# Patient Record
Sex: Female | Born: 1970 | Race: White | Hispanic: No | Marital: Married | Smoking: Never smoker
Health system: Southern US, Community
[De-identification: ages and names within clinical notes are randomized; demographics above are authoritative.]

## PROBLEM LIST (undated history)

## (undated) DIAGNOSIS — E079 Disorder of thyroid, unspecified: Secondary | ICD-10-CM

## (undated) HISTORY — DX: Disorder of thyroid, unspecified: E07.9

---

## 1997-03-27 ENCOUNTER — Inpatient Hospital Stay (HOSPITAL_COMMUNITY): Admission: AD | Admit: 1997-03-27 | Discharge: 1997-03-30 | Payer: Self-pay | Admitting: Gynecology

## 1997-04-29 ENCOUNTER — Other Ambulatory Visit: Admission: RE | Admit: 1997-04-29 | Discharge: 1997-04-29 | Payer: Self-pay | Admitting: Obstetrics and Gynecology

## 1997-10-09 ENCOUNTER — Ambulatory Visit (HOSPITAL_COMMUNITY): Admission: RE | Admit: 1997-10-09 | Discharge: 1997-10-09 | Payer: Self-pay | Admitting: Dermatology

## 1998-03-13 ENCOUNTER — Other Ambulatory Visit: Admission: RE | Admit: 1998-03-13 | Discharge: 1998-03-13 | Payer: Self-pay | Admitting: Gynecology

## 1999-03-24 ENCOUNTER — Other Ambulatory Visit: Admission: RE | Admit: 1999-03-24 | Discharge: 1999-03-24 | Payer: Self-pay | Admitting: Gynecology

## 2000-02-25 ENCOUNTER — Other Ambulatory Visit: Admission: RE | Admit: 2000-02-25 | Discharge: 2000-02-25 | Payer: Self-pay | Admitting: Gynecology

## 2001-03-12 ENCOUNTER — Other Ambulatory Visit: Admission: RE | Admit: 2001-03-12 | Discharge: 2001-03-12 | Payer: Self-pay | Admitting: Gynecology

## 2002-02-19 ENCOUNTER — Other Ambulatory Visit: Admission: RE | Admit: 2002-02-19 | Discharge: 2002-02-19 | Payer: Self-pay | Admitting: Gynecology

## 2003-01-30 ENCOUNTER — Other Ambulatory Visit: Admission: RE | Admit: 2003-01-30 | Discharge: 2003-01-30 | Payer: Self-pay | Admitting: Gynecology

## 2004-02-06 ENCOUNTER — Other Ambulatory Visit: Admission: RE | Admit: 2004-02-06 | Discharge: 2004-02-06 | Payer: Self-pay | Admitting: Gynecology

## 2005-02-21 ENCOUNTER — Other Ambulatory Visit: Admission: RE | Admit: 2005-02-21 | Discharge: 2005-02-21 | Payer: Self-pay | Admitting: Gynecology

## 2005-02-22 ENCOUNTER — Ambulatory Visit (HOSPITAL_COMMUNITY): Admission: RE | Admit: 2005-02-22 | Discharge: 2005-02-22 | Payer: Self-pay | Admitting: Gynecology

## 2006-01-13 ENCOUNTER — Encounter: Admission: RE | Admit: 2006-01-13 | Discharge: 2006-01-13 | Payer: Self-pay | Admitting: Gynecology

## 2006-07-19 ENCOUNTER — Other Ambulatory Visit: Admission: RE | Admit: 2006-07-19 | Discharge: 2006-07-19 | Payer: Self-pay | Admitting: Gynecology

## 2007-07-19 ENCOUNTER — Other Ambulatory Visit: Admission: RE | Admit: 2007-07-19 | Discharge: 2007-07-19 | Payer: Self-pay | Admitting: Gynecology

## 2016-03-22 DIAGNOSIS — Z6823 Body mass index (BMI) 23.0-23.9, adult: Secondary | ICD-10-CM | POA: Diagnosis not present

## 2016-03-22 DIAGNOSIS — Z131 Encounter for screening for diabetes mellitus: Secondary | ICD-10-CM | POA: Diagnosis not present

## 2016-03-22 DIAGNOSIS — Z1231 Encounter for screening mammogram for malignant neoplasm of breast: Secondary | ICD-10-CM | POA: Diagnosis not present

## 2016-03-22 DIAGNOSIS — Z13220 Encounter for screening for lipoid disorders: Secondary | ICD-10-CM | POA: Diagnosis not present

## 2016-03-22 DIAGNOSIS — Z01419 Encounter for gynecological examination (general) (routine) without abnormal findings: Secondary | ICD-10-CM | POA: Diagnosis not present

## 2016-03-22 DIAGNOSIS — E039 Hypothyroidism, unspecified: Secondary | ICD-10-CM | POA: Diagnosis not present

## 2016-03-22 DIAGNOSIS — N912 Amenorrhea, unspecified: Secondary | ICD-10-CM | POA: Diagnosis not present

## 2017-03-27 DIAGNOSIS — R82998 Other abnormal findings in urine: Secondary | ICD-10-CM | POA: Diagnosis not present

## 2017-03-27 DIAGNOSIS — Z808 Family history of malignant neoplasm of other organs or systems: Secondary | ICD-10-CM | POA: Diagnosis not present

## 2017-03-27 DIAGNOSIS — Z13228 Encounter for screening for other metabolic disorders: Secondary | ICD-10-CM | POA: Diagnosis not present

## 2017-03-27 DIAGNOSIS — R399 Unspecified symptoms and signs involving the genitourinary system: Secondary | ICD-10-CM | POA: Diagnosis not present

## 2017-03-27 DIAGNOSIS — Z01419 Encounter for gynecological examination (general) (routine) without abnormal findings: Secondary | ICD-10-CM | POA: Diagnosis not present

## 2017-03-27 DIAGNOSIS — Z1231 Encounter for screening mammogram for malignant neoplasm of breast: Secondary | ICD-10-CM | POA: Diagnosis not present

## 2017-03-27 DIAGNOSIS — Z6825 Body mass index (BMI) 25.0-25.9, adult: Secondary | ICD-10-CM | POA: Diagnosis not present

## 2017-03-27 DIAGNOSIS — Z13220 Encounter for screening for lipoid disorders: Secondary | ICD-10-CM | POA: Diagnosis not present

## 2017-03-27 DIAGNOSIS — Z1329 Encounter for screening for other suspected endocrine disorder: Secondary | ICD-10-CM | POA: Diagnosis not present

## 2017-03-27 DIAGNOSIS — Z800 Family history of malignant neoplasm of digestive organs: Secondary | ICD-10-CM | POA: Diagnosis not present

## 2017-03-27 DIAGNOSIS — Z8042 Family history of malignant neoplasm of prostate: Secondary | ICD-10-CM | POA: Diagnosis not present

## 2017-03-27 DIAGNOSIS — Z1321 Encounter for screening for nutritional disorder: Secondary | ICD-10-CM | POA: Diagnosis not present

## 2017-03-27 DIAGNOSIS — E559 Vitamin D deficiency, unspecified: Secondary | ICD-10-CM | POA: Diagnosis not present

## 2017-08-11 DIAGNOSIS — M9905 Segmental and somatic dysfunction of pelvic region: Secondary | ICD-10-CM | POA: Diagnosis not present

## 2017-08-11 DIAGNOSIS — M5442 Lumbago with sciatica, left side: Secondary | ICD-10-CM | POA: Diagnosis not present

## 2017-08-11 DIAGNOSIS — M5137 Other intervertebral disc degeneration, lumbosacral region: Secondary | ICD-10-CM | POA: Diagnosis not present

## 2017-08-11 DIAGNOSIS — M9903 Segmental and somatic dysfunction of lumbar region: Secondary | ICD-10-CM | POA: Diagnosis not present

## 2017-08-14 DIAGNOSIS — M9903 Segmental and somatic dysfunction of lumbar region: Secondary | ICD-10-CM | POA: Diagnosis not present

## 2017-08-14 DIAGNOSIS — M9905 Segmental and somatic dysfunction of pelvic region: Secondary | ICD-10-CM | POA: Diagnosis not present

## 2017-08-14 DIAGNOSIS — M5137 Other intervertebral disc degeneration, lumbosacral region: Secondary | ICD-10-CM | POA: Diagnosis not present

## 2017-08-14 DIAGNOSIS — M5442 Lumbago with sciatica, left side: Secondary | ICD-10-CM | POA: Diagnosis not present

## 2017-08-17 DIAGNOSIS — M9903 Segmental and somatic dysfunction of lumbar region: Secondary | ICD-10-CM | POA: Diagnosis not present

## 2017-08-17 DIAGNOSIS — M9905 Segmental and somatic dysfunction of pelvic region: Secondary | ICD-10-CM | POA: Diagnosis not present

## 2017-08-17 DIAGNOSIS — M5442 Lumbago with sciatica, left side: Secondary | ICD-10-CM | POA: Diagnosis not present

## 2017-08-17 DIAGNOSIS — M5137 Other intervertebral disc degeneration, lumbosacral region: Secondary | ICD-10-CM | POA: Diagnosis not present

## 2017-09-11 DIAGNOSIS — M5442 Lumbago with sciatica, left side: Secondary | ICD-10-CM | POA: Diagnosis not present

## 2017-09-11 DIAGNOSIS — G8929 Other chronic pain: Secondary | ICD-10-CM | POA: Diagnosis not present

## 2017-09-14 DIAGNOSIS — D1801 Hemangioma of skin and subcutaneous tissue: Secondary | ICD-10-CM | POA: Diagnosis not present

## 2017-09-14 DIAGNOSIS — D2272 Melanocytic nevi of left lower limb, including hip: Secondary | ICD-10-CM | POA: Diagnosis not present

## 2017-09-14 DIAGNOSIS — D225 Melanocytic nevi of trunk: Secondary | ICD-10-CM | POA: Diagnosis not present

## 2017-09-14 DIAGNOSIS — D2262 Melanocytic nevi of left upper limb, including shoulder: Secondary | ICD-10-CM | POA: Diagnosis not present

## 2017-10-04 DIAGNOSIS — M545 Low back pain: Secondary | ICD-10-CM | POA: Diagnosis not present

## 2017-10-04 DIAGNOSIS — M5442 Lumbago with sciatica, left side: Secondary | ICD-10-CM | POA: Diagnosis not present

## 2017-10-12 DIAGNOSIS — M5442 Lumbago with sciatica, left side: Secondary | ICD-10-CM | POA: Diagnosis not present

## 2017-11-13 DIAGNOSIS — R2232 Localized swelling, mass and lump, left upper limb: Secondary | ICD-10-CM | POA: Diagnosis not present

## 2017-11-13 DIAGNOSIS — M79642 Pain in left hand: Secondary | ICD-10-CM | POA: Diagnosis not present

## 2017-11-13 DIAGNOSIS — M79641 Pain in right hand: Secondary | ICD-10-CM | POA: Diagnosis not present

## 2017-12-20 ENCOUNTER — Other Ambulatory Visit: Payer: Self-pay | Admitting: Radiology

## 2017-12-20 DIAGNOSIS — N644 Mastodynia: Secondary | ICD-10-CM

## 2017-12-26 ENCOUNTER — Other Ambulatory Visit: Payer: Self-pay

## 2017-12-29 ENCOUNTER — Ambulatory Visit
Admission: RE | Admit: 2017-12-29 | Discharge: 2017-12-29 | Disposition: A | Discharge status: Home / Self Care | Payer: BLUE CROSS/BLUE SHIELD | Source: Ambulatory Visit | Attending: Radiology | Admitting: Radiology

## 2017-12-29 DIAGNOSIS — R918 Other nonspecific abnormal finding of lung field: Secondary | ICD-10-CM | POA: Diagnosis not present

## 2017-12-29 DIAGNOSIS — N644 Mastodynia: Secondary | ICD-10-CM

## 2018-07-02 DIAGNOSIS — Z6826 Body mass index (BMI) 26.0-26.9, adult: Secondary | ICD-10-CM | POA: Diagnosis not present

## 2018-07-02 DIAGNOSIS — Z1231 Encounter for screening mammogram for malignant neoplasm of breast: Secondary | ICD-10-CM | POA: Diagnosis not present

## 2018-07-02 DIAGNOSIS — Z01419 Encounter for gynecological examination (general) (routine) without abnormal findings: Secondary | ICD-10-CM | POA: Diagnosis not present

## 2018-08-27 DIAGNOSIS — D1801 Hemangioma of skin and subcutaneous tissue: Secondary | ICD-10-CM | POA: Diagnosis not present

## 2018-08-27 DIAGNOSIS — D2262 Melanocytic nevi of left upper limb, including shoulder: Secondary | ICD-10-CM | POA: Diagnosis not present

## 2018-08-27 DIAGNOSIS — D2272 Melanocytic nevi of left lower limb, including hip: Secondary | ICD-10-CM | POA: Diagnosis not present

## 2018-08-27 DIAGNOSIS — D225 Melanocytic nevi of trunk: Secondary | ICD-10-CM | POA: Diagnosis not present

## 2019-07-04 DIAGNOSIS — Z6823 Body mass index (BMI) 23.0-23.9, adult: Secondary | ICD-10-CM | POA: Diagnosis not present

## 2019-07-04 DIAGNOSIS — Z1231 Encounter for screening mammogram for malignant neoplasm of breast: Secondary | ICD-10-CM | POA: Diagnosis not present

## 2019-07-04 DIAGNOSIS — Z1321 Encounter for screening for nutritional disorder: Secondary | ICD-10-CM | POA: Diagnosis not present

## 2019-07-04 DIAGNOSIS — Z01419 Encounter for gynecological examination (general) (routine) without abnormal findings: Secondary | ICD-10-CM | POA: Diagnosis not present

## 2019-07-04 DIAGNOSIS — Z1329 Encounter for screening for other suspected endocrine disorder: Secondary | ICD-10-CM | POA: Diagnosis not present

## 2019-07-04 DIAGNOSIS — Z13220 Encounter for screening for lipoid disorders: Secondary | ICD-10-CM | POA: Diagnosis not present

## 2019-07-04 DIAGNOSIS — Z131 Encounter for screening for diabetes mellitus: Secondary | ICD-10-CM | POA: Diagnosis not present

## 2019-08-20 DIAGNOSIS — Z20822 Contact with and (suspected) exposure to covid-19: Secondary | ICD-10-CM | POA: Diagnosis not present

## 2019-11-11 ENCOUNTER — Encounter: Payer: Self-pay | Admitting: Endocrinology

## 2019-11-11 ENCOUNTER — Other Ambulatory Visit: Payer: Self-pay

## 2019-11-11 ENCOUNTER — Ambulatory Visit (INDEPENDENT_AMBULATORY_CARE_PROVIDER_SITE_OTHER): Payer: BC Managed Care – PPO | Admitting: Endocrinology

## 2019-11-11 DIAGNOSIS — E039 Hypothyroidism, unspecified: Secondary | ICD-10-CM | POA: Insufficient documentation

## 2019-11-11 LAB — TSH: TSH: 0.02 u[IU]/mL — ABNORMAL LOW (ref 0.35–4.50)

## 2019-11-11 MED ORDER — LEVOTHYROXINE SODIUM 112 MCG PO TABS: 112.0000 ug | ORAL_TABLET | Freq: Every day | ORAL | 3 refills | Status: DC

## 2019-11-11 NOTE — Patient Instructions (Addendum)
Let's recheck the ultrasound.  you will receive a phone call, about a day and time for an appointment.   Blood tests are requested for you today.  We'll let you know about the results.    

## 2019-11-11 NOTE — Progress Notes (Signed)
Subjective:    Patient ID: Melissa Hartman, female    DOB: [birth year], 49 y.o.   MRN: [id number]  HPI Pt is referred by Dr Renaldo Fiddler, for hypothyroidism.  Pt reports hypothyroidism was dx'ed in mid-2021. she has been on prescribed thyroid hormone therapy since rx.  she has never taken kelp or any other type of non-prescribed thyroid product.  she has never had thyroid imaging.  She has never had thyroid surgery, or XRT to the neck.  she has never been on amiodarone or lithium.  She now report swelling at the ant neck.   Past Medical History:  Diagnosis Date  . Thyroid disease     History reviewed. No pertinent surgical history.  Social History   Socioeconomic History  . Marital status: Married    Spouse name: Not on file  . Number of children: Not on file  . Years of education: Not on file  . Highest education level: Not on file  Occupational History  . Not on file  Tobacco Use  . Smoking status: Never Smoker  . Smokeless tobacco: Current User  Substance and Sexual Activity  . Alcohol use: Never  . Drug use: Never  . Sexual activity: Not on file  Other Topics Concern  . Not on file  Social History Narrative  . Not on file   Social Determinants of Health   Financial Resource Strain:   . Difficulty of Paying Living Expenses: Not on file  Food Insecurity:   . Worried About Programme researcher, broadcasting/film/video in the Last Year: Not on file  . Ran Out of Food in the Last Year: Not on file  Transportation Needs:   . Lack of Transportation (Medical): Not on file  . Lack of Transportation (Non-Medical): Not on file  Physical Activity:   . Days of Exercise per Week: Not on file  . Minutes of Exercise per Session: Not on file  Stress:   . Feeling of Stress : Not on file  Social Connections:   . Frequency of Communication with Friends and Family: Not on file  . Frequency of Social Gatherings with Friends and Family: Not on file  . Attends Religious Services: Not on file  . Active Member of  Clubs or Organizations: Not on file  . Attends Banker Meetings: Not on file  . Marital Status: Not on file  Intimate Partner Violence:   . Fear of Current or Ex-Partner: Not on file  . Emotionally Abused: Not on file  . Physically Abused: Not on file  . Sexually Abused: Not on file    Current Outpatient Medications on File Prior to Visit  Medication Sig Dispense Refill  . BIOTIN PO Take by mouth.    . cyanocobalamin 100 MCG tablet Take by mouth.    Marland Kitchen VITAMIN D PO Take by mouth.     No current facility-administered medications on file prior to visit.    No Known Allergies  Family History  Problem Relation Age of Onset  . Thyroid disease Mother     BP 124/64   Pulse 78   Ht 5\' 2"  (1.575 m)   Wt 131 lb (59.4 kg)   SpO2 98%   BMI 23.96 kg/m    Review of Systems denies depression, weight gain, numbness, cold intolerance, and dry skin.  She has chronic hair loss and leg cramps.      Objective:   Physical Exam VS: see vs page GEN: no distress HEAD: head: no deformity eyes:  no periorbital swelling, no proptosis external nose and ears are normal NECK: supple, thyroid is not enlarged CHEST WALL: no deformity LUNGS: clear to auscultation CV: reg rate and rhythm, no murmur.  MUSCULOSKELETAL: gait is normal and steady EXTEMITIES: no deformity.  no leg edema NEURO:  cn 2-12 grossly intact.   readily moves all 4's.  sensation is intact to touch on all 4's SKIN:  Normal texture and temperature.  No rash or suspicious lesion is visible.   NODES:  None palpable at the neck PSYCH: alert, well-oriented.  Does not appear anxious nor depressed.  outside test results are reviewed: TSH=0.1  Lab Results  Component Value Date   TSH 0.02 (L) 11/11/2019    I have reviewed outside records, and summarized: Pt was noted to have abnormal TSH, and referred here.  She was seen for routine GYN exam, and overall health was good.     Assessment & Plan:  Hypothyroidism:  overreplaced.  I have sent a prescription to your pharmacy, to reduce synthroid Neck swelling, uncertain etiology and prognosis  Patient Instructions  Let's recheck the ultrasound.  you will receive a phone call, about a day and time for an appointment. Blood tests are requested for you today.  We'll let you know about the results.

## 2019-11-14 ENCOUNTER — Encounter: Payer: Self-pay | Admitting: Endocrinology

## 2019-11-14 ENCOUNTER — Telehealth: Payer: Self-pay | Admitting: Endocrinology

## 2019-11-14 NOTE — Telephone Encounter (Signed)
Patient states her OBGYN had prescribed her levothyroxine 137 but Dr Everardo All changed dose to 112 and she was wondering why it changed? Please give patient a call at [phone] (no interpreter needed).

## 2019-11-15 NOTE — Telephone Encounter (Signed)
Please advise-  Mychart message was sent also, you can respond via that.   Thank you!

## 2019-11-21 ENCOUNTER — Ambulatory Visit
Admission: RE | Admit: 2019-11-21 | Discharge: 2019-11-21 | Disposition: A | Discharge status: Home / Self Care | Payer: BC Managed Care – PPO | Source: Ambulatory Visit | Attending: Endocrinology | Admitting: Endocrinology

## 2019-11-21 DIAGNOSIS — E039 Hypothyroidism, unspecified: Secondary | ICD-10-CM

## 2020-02-24 DIAGNOSIS — D2261 Melanocytic nevi of right upper limb, including shoulder: Secondary | ICD-10-CM | POA: Diagnosis not present

## 2020-02-24 DIAGNOSIS — D692 Other nonthrombocytopenic purpura: Secondary | ICD-10-CM | POA: Diagnosis not present

## 2020-02-24 DIAGNOSIS — R208 Other disturbances of skin sensation: Secondary | ICD-10-CM | POA: Diagnosis not present

## 2020-05-25 IMAGING — MG DIGITAL DIAGNOSTIC UNILATERAL LEFT MAMMOGRAM WITH TOMO AND CAD
4 series · 4 of 12 positions shown · non-contrast
Comparison: Previous exam(s).

CLINICAL DATA: 47-year-old female with intermittent burning
sensation within the upper-outer quadrant of the left breast for
several weeks.

EXAM:
DIGITAL DIAGNOSTIC UNILATERAL LEFT MAMMOGRAM WITH CAD AND TOMO

[L MLO synth-2D]
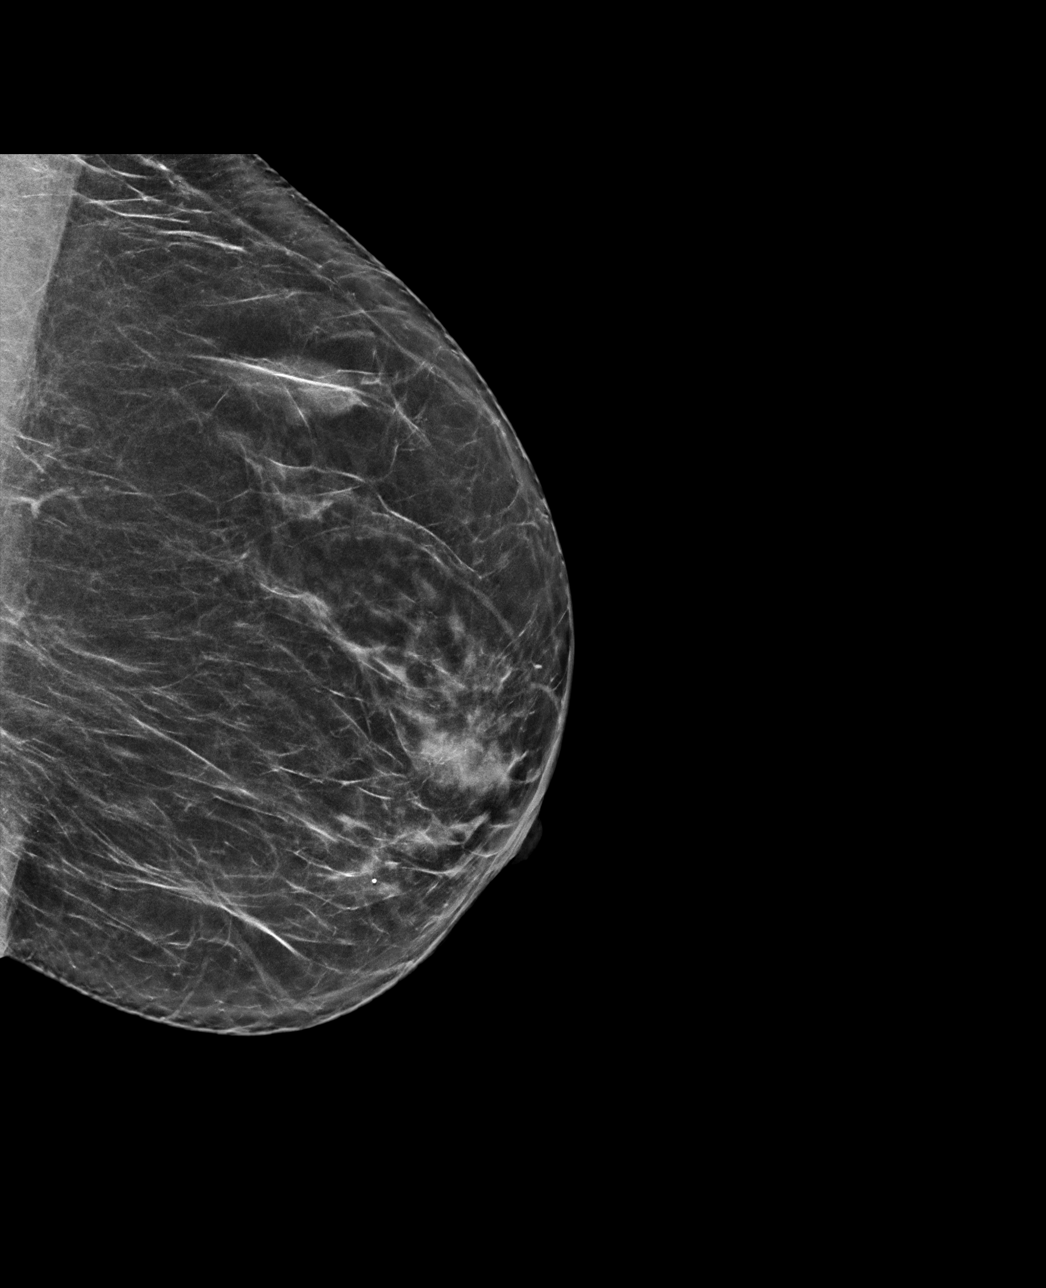

[L CC synth-2D]
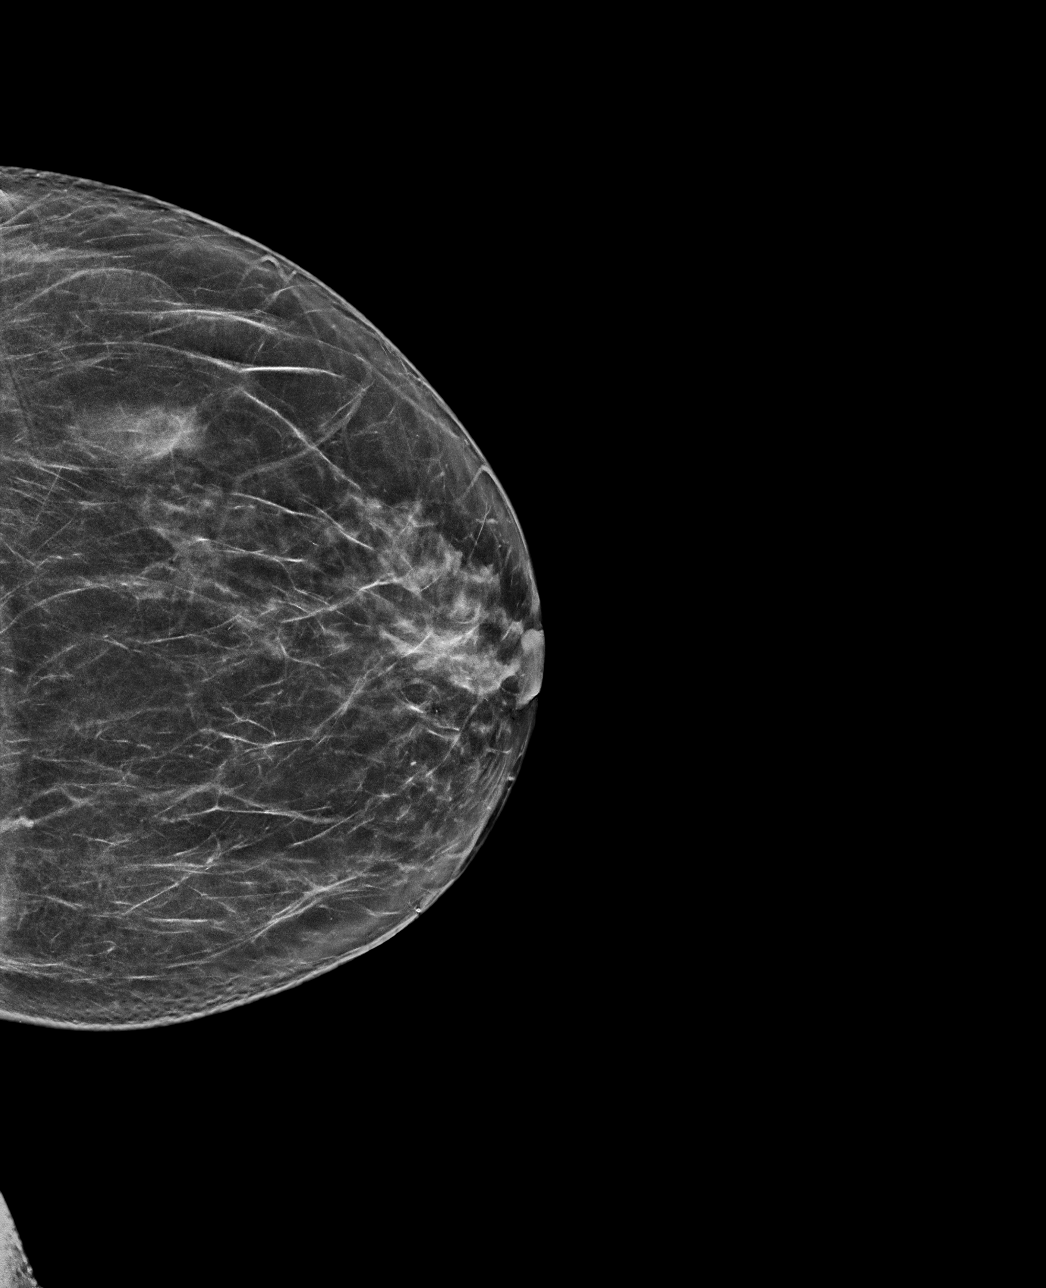

[L CC tomo · tomo slice 35/69.0]
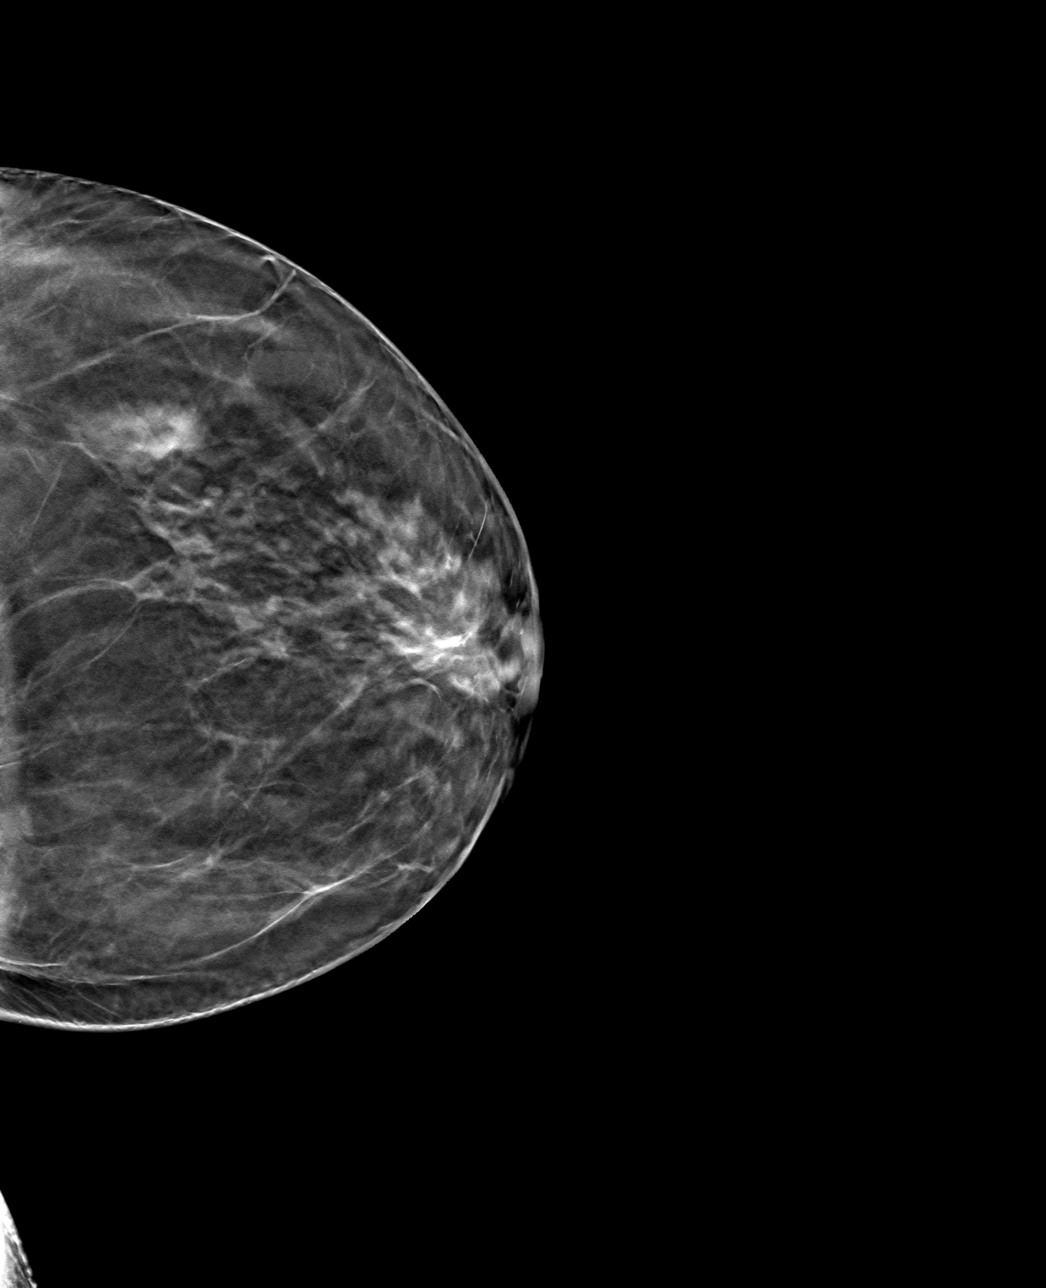

[L MLO tomo · tomo slice 36/71.0]
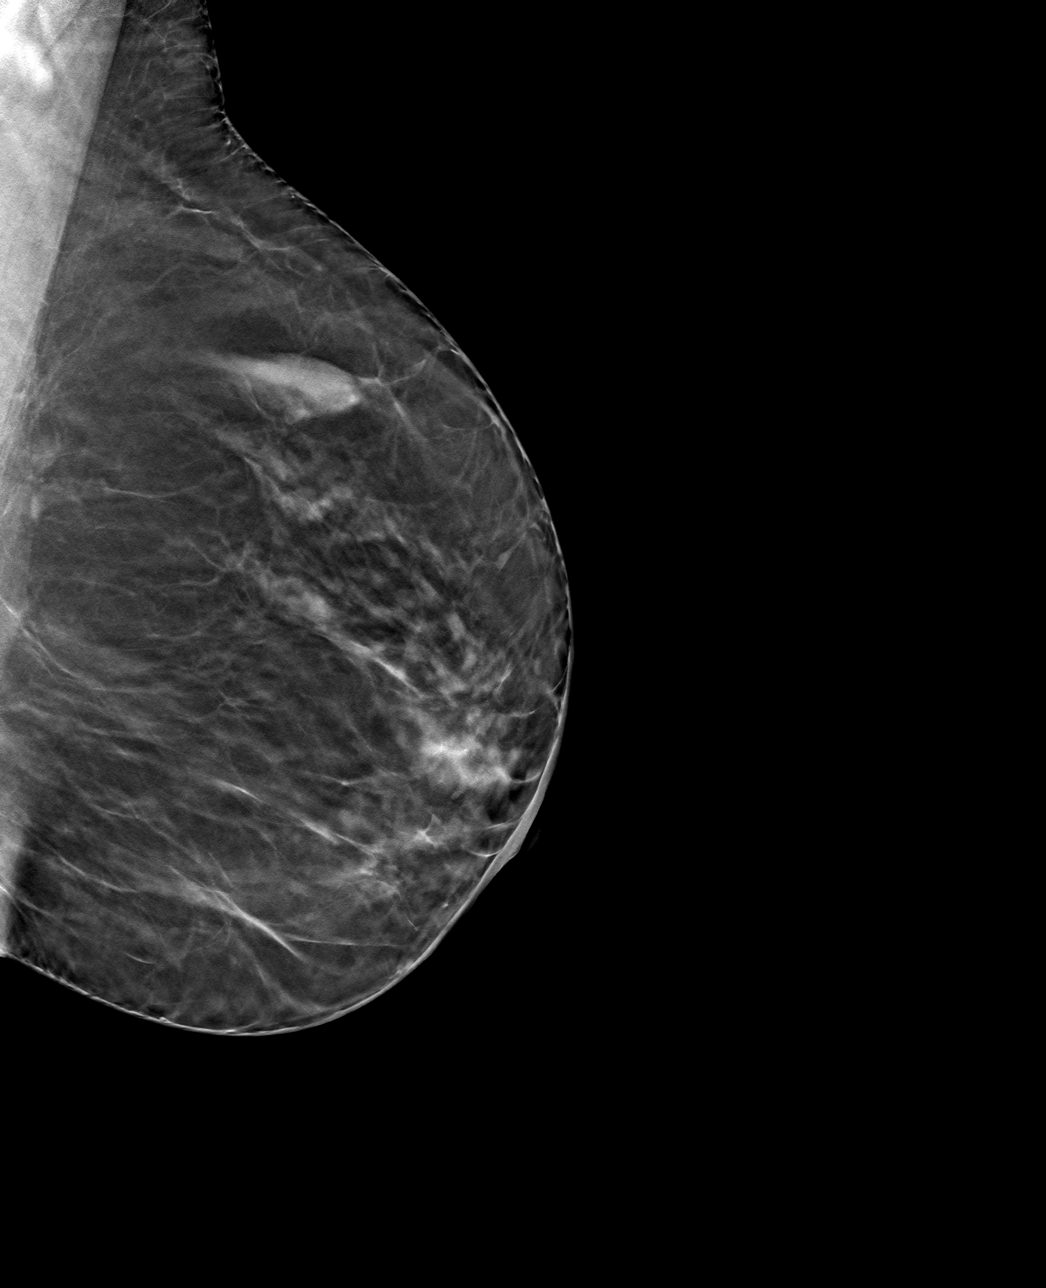

[4 of 12 positions shown; findings below may reference images not displayed]

ACR Breast Density Category b: There are scattered areas of
fibroglandular density.
FINDINGS: No suspicious mammographic findings are identified. The parenchymal
pattern is stable.

Mammographic images were processed with CAD.
IMPRESSION: No mammographic evidence of malignancy.

RECOMMENDATION:
1. Clinical follow-up recommended for the symptomatic area of
concern in the left breast. Any further workup should be based on
clinical grounds.
2. Patient is due for annual bilateral screening in March 2018.

I have discussed the findings and recommendations with the patient.
Results were also provided in writing at the conclusion of the
visit. If applicable, a reminder letter will be sent to the patient
regarding the next appointment.

BI-RADS CATEGORY  1: Negative.

## 2020-09-02 DIAGNOSIS — N958 Other specified menopausal and perimenopausal disorders: Secondary | ICD-10-CM | POA: Diagnosis not present

## 2020-09-02 DIAGNOSIS — Z13820 Encounter for screening for osteoporosis: Secondary | ICD-10-CM | POA: Diagnosis not present

## 2020-09-02 DIAGNOSIS — Z01419 Encounter for gynecological examination (general) (routine) without abnormal findings: Secondary | ICD-10-CM | POA: Diagnosis not present

## 2020-09-02 DIAGNOSIS — M8588 Other specified disorders of bone density and structure, other site: Secondary | ICD-10-CM | POA: Diagnosis not present

## 2020-09-02 DIAGNOSIS — Z1231 Encounter for screening mammogram for malignant neoplasm of breast: Secondary | ICD-10-CM | POA: Diagnosis not present

## 2020-09-02 DIAGNOSIS — Z6825 Body mass index (BMI) 25.0-25.9, adult: Secondary | ICD-10-CM | POA: Diagnosis not present

## 2020-10-07 ENCOUNTER — Telehealth: Payer: Self-pay | Admitting: Endocrinology

## 2020-10-07 NOTE — Telephone Encounter (Signed)
Pt is calling in for the lab work that was sent in from Dr office and inquiring about a levthyroxine dosage change. Pharm [facility] [address] Pt contact: [phone]

## 2020-10-09 ENCOUNTER — Other Ambulatory Visit: Payer: Self-pay | Admitting: Endocrinology

## 2020-10-09 MED ORDER — LEVOTHYROXINE SODIUM 100 MCG PO TABS: 100.0000 ug | ORAL_TABLET | Freq: Every day | ORAL | 3 refills | Status: DC

## 2020-10-09 NOTE — Telephone Encounter (Signed)
Detailed vm left for patient.  ?

## 2020-10-09 NOTE — Telephone Encounter (Signed)
Labs have been received and printed out for provider to review

## 2020-10-09 NOTE — Telephone Encounter (Signed)
Pt called about lab results (scanned in media files) about a thyroid medication adjustment. Pt doesn't use MyChart and cannot receive msgs. Pt insists a phone call would suffice. Also, [facility] [address] is her preference. Pt contact [phone]

## 2020-11-11 ENCOUNTER — Other Ambulatory Visit: Payer: Self-pay | Admitting: Endocrinology

## 2021-09-08 DIAGNOSIS — Z6823 Body mass index (BMI) 23.0-23.9, adult: Secondary | ICD-10-CM | POA: Diagnosis not present

## 2021-09-08 DIAGNOSIS — Z13228 Encounter for screening for other metabolic disorders: Secondary | ICD-10-CM | POA: Diagnosis not present

## 2021-09-08 DIAGNOSIS — Z1329 Encounter for screening for other suspected endocrine disorder: Secondary | ICD-10-CM | POA: Diagnosis not present

## 2021-09-08 DIAGNOSIS — Z1231 Encounter for screening mammogram for malignant neoplasm of breast: Secondary | ICD-10-CM | POA: Diagnosis not present

## 2021-09-08 DIAGNOSIS — R319 Hematuria, unspecified: Secondary | ICD-10-CM | POA: Diagnosis not present

## 2021-09-08 DIAGNOSIS — Z13220 Encounter for screening for lipoid disorders: Secondary | ICD-10-CM | POA: Diagnosis not present

## 2021-09-08 DIAGNOSIS — Z01419 Encounter for gynecological examination (general) (routine) without abnormal findings: Secondary | ICD-10-CM | POA: Diagnosis not present

## 2021-09-10 DIAGNOSIS — D225 Melanocytic nevi of trunk: Secondary | ICD-10-CM | POA: Diagnosis not present

## 2021-09-10 DIAGNOSIS — R208 Other disturbances of skin sensation: Secondary | ICD-10-CM | POA: Diagnosis not present

## 2021-09-10 DIAGNOSIS — L814 Other melanin hyperpigmentation: Secondary | ICD-10-CM | POA: Diagnosis not present

## 2021-09-10 DIAGNOSIS — L821 Other seborrheic keratosis: Secondary | ICD-10-CM | POA: Diagnosis not present

## 2021-09-10 DIAGNOSIS — D1801 Hemangioma of skin and subcutaneous tissue: Secondary | ICD-10-CM | POA: Diagnosis not present

## 2021-10-22 ENCOUNTER — Ambulatory Visit (AMBULATORY_SURGERY_CENTER): Payer: Self-pay

## 2021-10-22 DIAGNOSIS — Z1211 Encounter for screening for malignant neoplasm of colon: Secondary | ICD-10-CM

## 2021-10-22 NOTE — Progress Notes (Signed)
Spoke with pt over phone for PV. Pt states she was told her colonoscopy would be Nov 10th. [No record of appt change in Epic].  Offered other dates in Nov, but pt declines, sating she will call back to reschedule when she can coordinate with a care partner.  Cancelled 11/05/21 [facility]-colon.

## 2021-11-05 ENCOUNTER — Encounter: Payer: BC Managed Care – PPO | Admitting: Internal Medicine

## 2021-11-11 ENCOUNTER — Other Ambulatory Visit: Payer: Self-pay

## 2021-11-22 ENCOUNTER — Other Ambulatory Visit: Payer: Self-pay | Admitting: Internal Medicine

## 2021-11-24 DIAGNOSIS — E039 Hypothyroidism, unspecified: Secondary | ICD-10-CM | POA: Diagnosis not present

## 2021-12-15 ENCOUNTER — Other Ambulatory Visit: Payer: Self-pay | Admitting: Internal Medicine

## 2022-01-21 ENCOUNTER — Ambulatory Visit: Payer: Self-pay | Admitting: Internal Medicine

## 2022-02-11 DIAGNOSIS — M79629 Pain in unspecified upper arm: Secondary | ICD-10-CM | POA: Diagnosis not present

## 2022-04-17 IMAGING — US US THYROID
1 series · 14 of 25 positions shown · non-contrast
Comparison: None.

CLINICAL DATA: Other. 49-year-old female with anterior neck
swelling

EXAM:
THYROID ULTRASOUND
TECHNIQUE: Ultrasound examination of the thyroid gland and adjacent soft
tissues was performed.

[Series 1: us thyroid · 0.04mm/px · 14 of 42 slices shown]
[im 1/42]
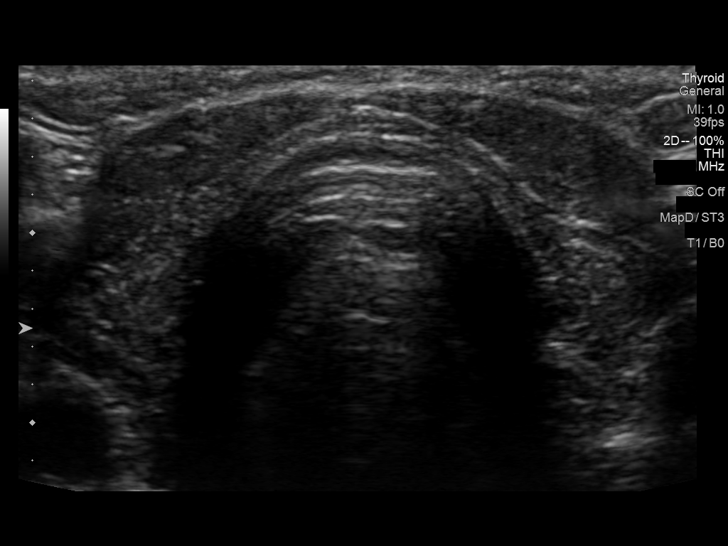
[im 4/42]
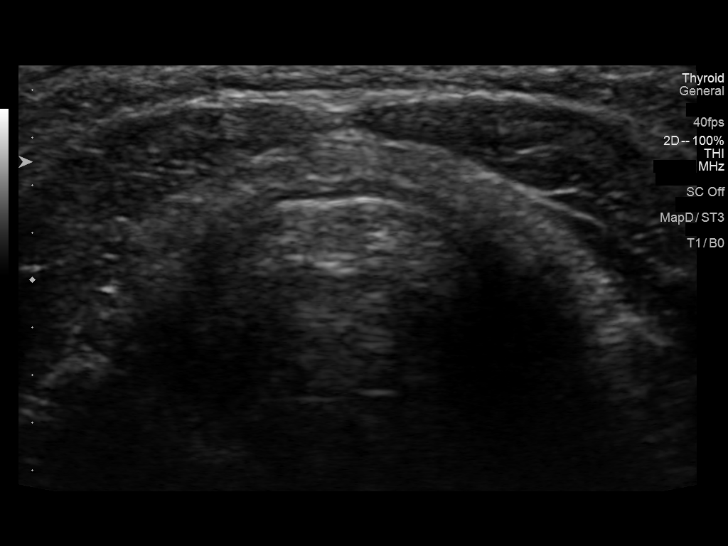
[im 7/42]
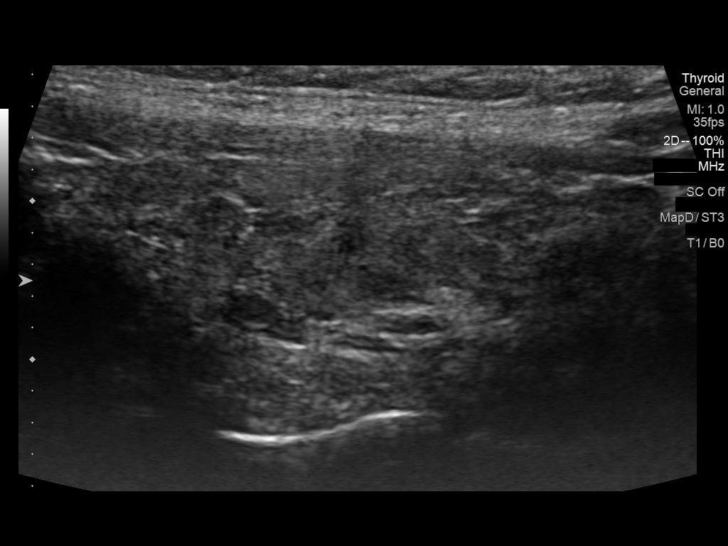
[im 11/42]
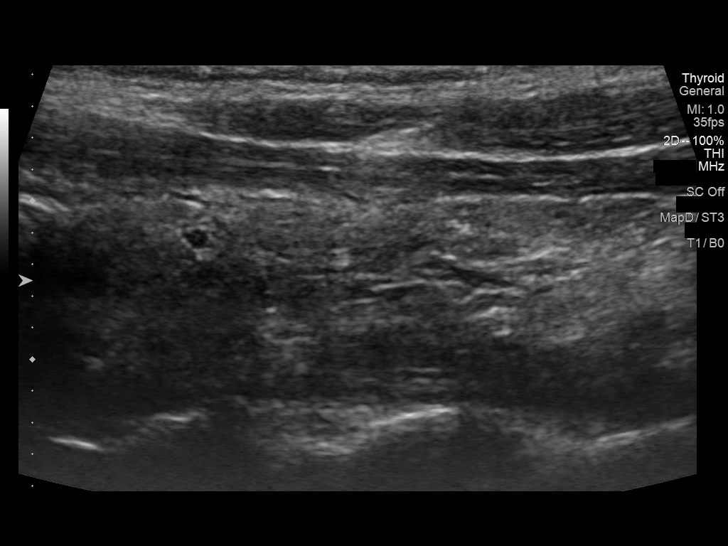
[im 14/42]
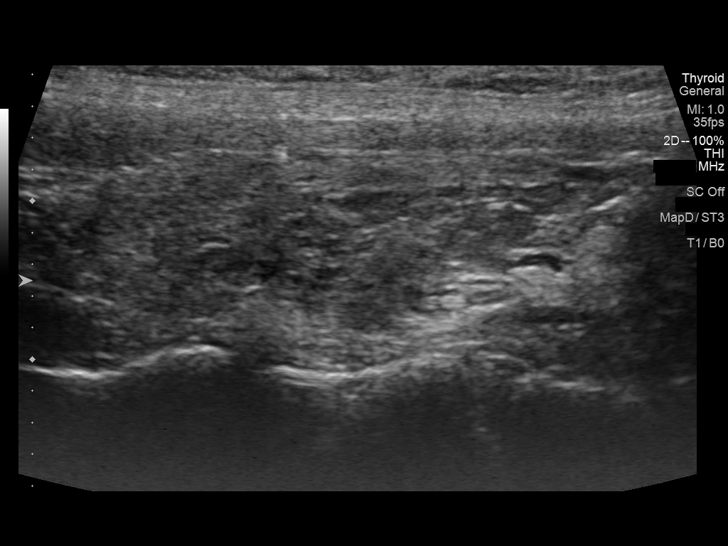
[im 16/42]
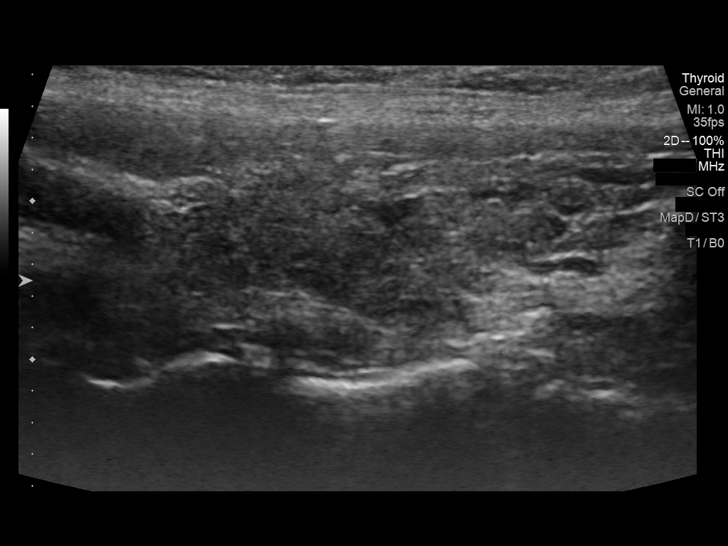
[im 19/42]
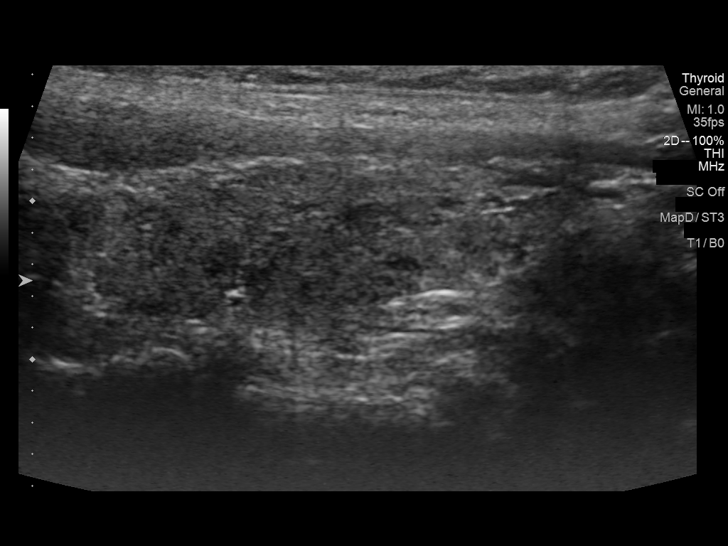
[im 23/42]
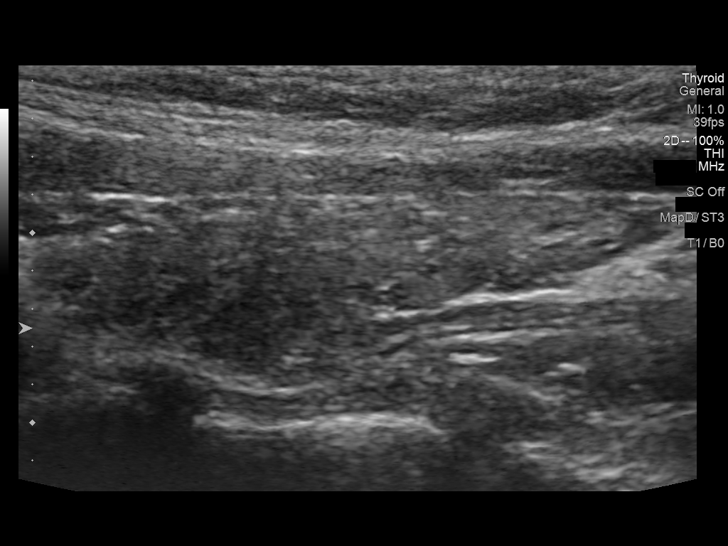
[im 26/42]
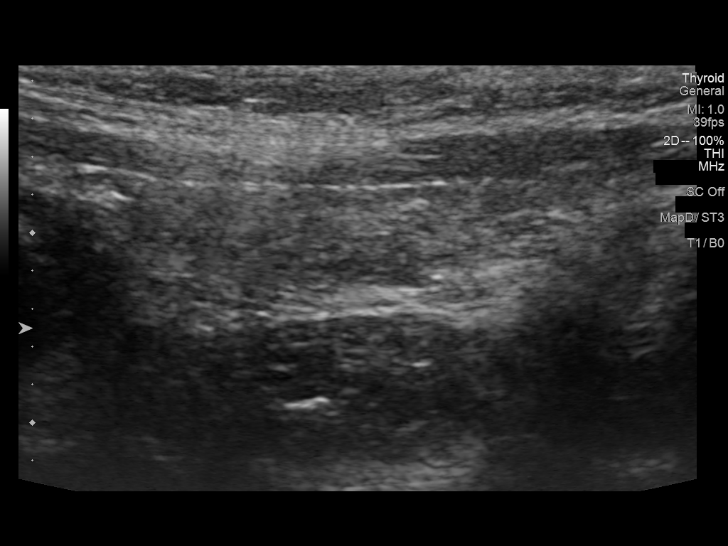
[im 28/42]
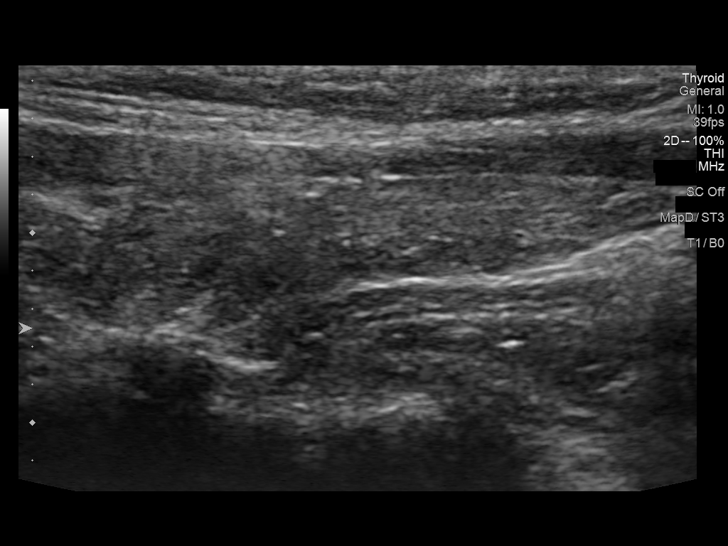
[im 31/42]
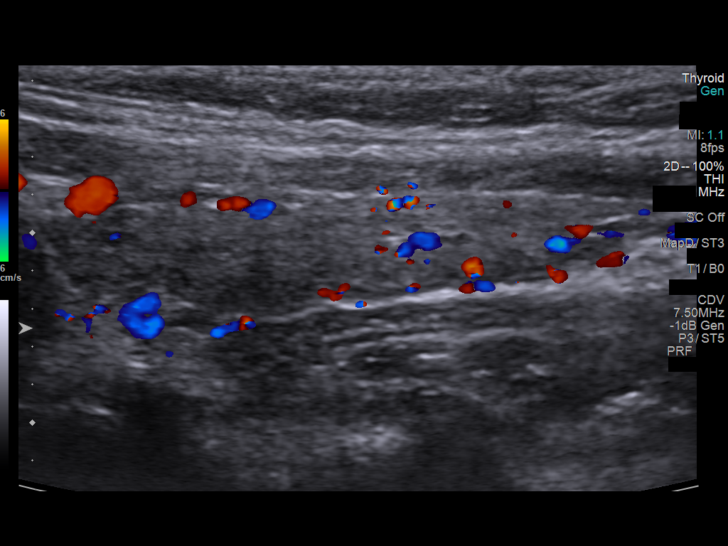
[im 35/42]
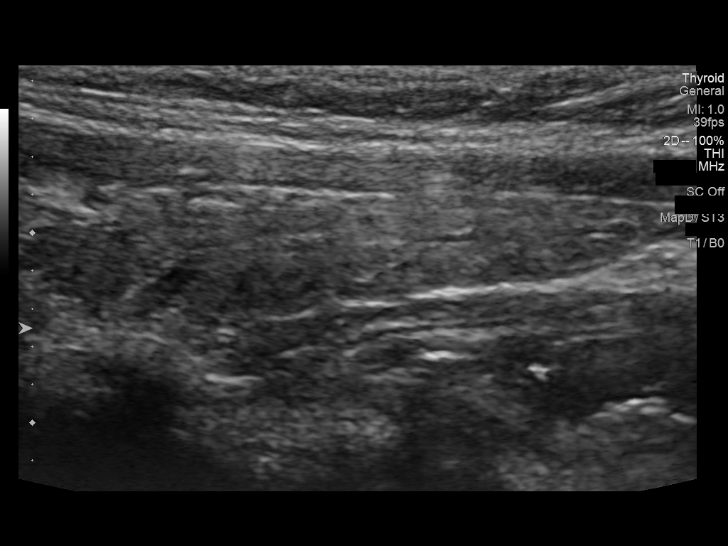
[im 38/42]
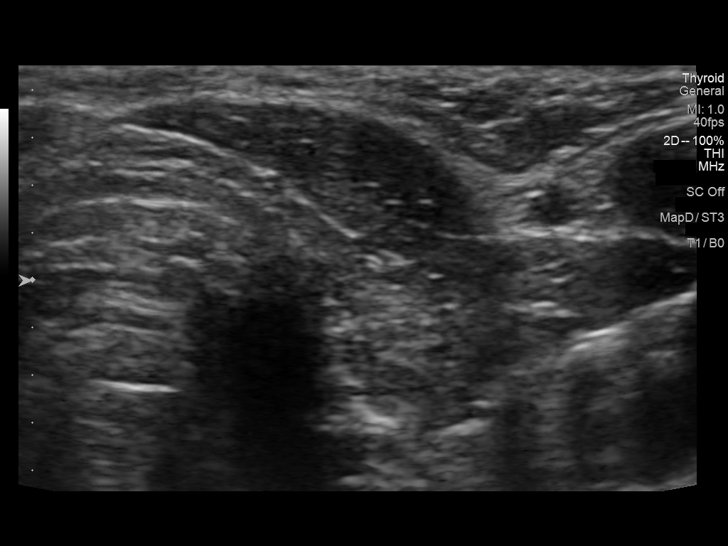
[im 42/42]
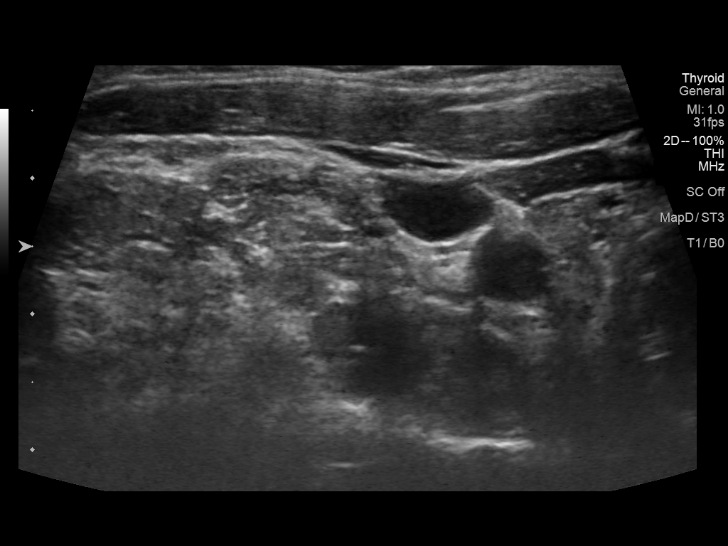

[14 of 25 positions shown; findings below may reference images not displayed]

FINDINGS: Parenchymal Echotexture: Moderately heterogenous

Isthmus: 0.1 cm

Right lobe: 3.9 x 1.2 x 1.0 cm

Left lobe: 3.3 x 0.9 x 0.8 cm

_________________________________________________________

Estimated total number of nodules >/= 1 cm: 0

Number of spongiform nodules >/=  2 cm not described below (TR1): 0

Number of mixed cystic and solid nodules >/= 1.5 cm not described
below (TR2): 0

_________________________________________________________

No discrete nodules are seen within the thyroid gland.
IMPRESSION: Heterogeneous parenchymal echotexture of a normal sized thyroid
gland without discrete nodules.

## 2022-09-13 DIAGNOSIS — Z1321 Encounter for screening for nutritional disorder: Secondary | ICD-10-CM | POA: Diagnosis not present

## 2022-09-13 DIAGNOSIS — Z13228 Encounter for screening for other metabolic disorders: Secondary | ICD-10-CM | POA: Diagnosis not present

## 2022-09-13 DIAGNOSIS — Z1231 Encounter for screening mammogram for malignant neoplasm of breast: Secondary | ICD-10-CM | POA: Diagnosis not present

## 2022-09-13 DIAGNOSIS — Z131 Encounter for screening for diabetes mellitus: Secondary | ICD-10-CM | POA: Diagnosis not present

## 2022-09-13 DIAGNOSIS — Z13220 Encounter for screening for lipoid disorders: Secondary | ICD-10-CM | POA: Diagnosis not present

## 2022-09-13 DIAGNOSIS — Z6826 Body mass index (BMI) 26.0-26.9, adult: Secondary | ICD-10-CM | POA: Diagnosis not present

## 2022-09-13 DIAGNOSIS — Z01419 Encounter for gynecological examination (general) (routine) without abnormal findings: Secondary | ICD-10-CM | POA: Diagnosis not present

## 2022-09-13 DIAGNOSIS — Z1329 Encounter for screening for other suspected endocrine disorder: Secondary | ICD-10-CM | POA: Diagnosis not present

## 2022-09-13 DIAGNOSIS — Z124 Encounter for screening for malignant neoplasm of cervix: Secondary | ICD-10-CM | POA: Diagnosis not present

## 2022-09-13 DIAGNOSIS — Z1151 Encounter for screening for human papillomavirus (HPV): Secondary | ICD-10-CM | POA: Diagnosis not present

## 2022-11-25 ENCOUNTER — Encounter: Payer: Self-pay | Admitting: Internal Medicine

## 2023-01-06 DIAGNOSIS — E039 Hypothyroidism, unspecified: Secondary | ICD-10-CM | POA: Diagnosis not present

## 2023-01-23 ENCOUNTER — Ambulatory Visit (AMBULATORY_SURGERY_CENTER): Payer: Self-pay

## 2023-01-23 VITALS — Ht 61.5 in | Wt 138.0 lb

## 2023-01-23 DIAGNOSIS — Z1211 Encounter for screening for malignant neoplasm of colon: Secondary | ICD-10-CM

## 2023-01-23 MED ORDER — NA SULFATE-K SULFATE-MG SULF 17.5-3.13-1.6 GM/177ML PO SOLN: 1.0000 | Freq: Once | ORAL | 0 refills | Status: DC

## 2023-01-23 NOTE — Progress Notes (Signed)
Patient did not show for her in-person, previsit appt.  Reached out to patient per telephone.  She is bilingual and speaks excellent English, states she does not need an interpreter.  Pre-visit completed via telephone.

## 2023-01-23 NOTE — Progress Notes (Signed)

## 2023-02-28 ENCOUNTER — Other Ambulatory Visit: Payer: Self-pay

## 2023-02-28 ENCOUNTER — Telehealth: Payer: Self-pay | Admitting: Internal Medicine

## 2023-02-28 DIAGNOSIS — Z1211 Encounter for screening for malignant neoplasm of colon: Secondary | ICD-10-CM

## 2023-02-28 MED ORDER — NA SULFATE-K SULFATE-MG SULF 17.5-3.13-1.6 GM/177ML PO SOLN: 1.0000 | Freq: Once | ORAL | 0 refills | Status: AC

## 2023-02-28 NOTE — Telephone Encounter (Signed)
 Inbound call from patient stating she has not received prep medication for 2/28 colonoscopy. Patient was not advise of [facility] on 1/20 pre visit. Requesting a call back. Please advise, thank you.

## 2023-02-28 NOTE — Telephone Encounter (Signed)
 Rx re sent pt made aware

## 2023-03-01 ENCOUNTER — Encounter: Payer: Self-pay | Admitting: Internal Medicine

## 2023-03-03 ENCOUNTER — Encounter: Payer: Self-pay | Admitting: Internal Medicine

## 2023-03-03 ENCOUNTER — Ambulatory Visit (AMBULATORY_SURGERY_CENTER): Payer: Self-pay | Admitting: Internal Medicine

## 2023-03-03 VITALS — BP 94/71 | HR 62 | Temp 98.0°F | Resp 13 | Ht 61.5 in | Wt 138.0 lb

## 2023-03-03 DIAGNOSIS — Z1211 Encounter for screening for malignant neoplasm of colon: Secondary | ICD-10-CM | POA: Diagnosis not present

## 2023-03-03 DIAGNOSIS — K648 Other hemorrhoids: Secondary | ICD-10-CM

## 2023-03-03 MED ORDER — SODIUM CHLORIDE 0.9 % IV SOLN: 500.0000 mL | Freq: Once | INTRAVENOUS | Status: DC

## 2023-03-03 NOTE — Progress Notes (Signed)
 Pt's states no medical or surgical changes since previsit or office visit.

## 2023-03-03 NOTE — Progress Notes (Signed)
 Pt denies need to pass air while in the RR.  Abdomen soft, denies tenderness.  Encouraged to ambulate, drink warm fluids at home if she develops abdominal tenderness

## 2023-03-03 NOTE — Op Note (Signed)
 [facility] Patient Name: Melissa Hartman Procedure Date: 03/03/2023 7:59 AM MRN: [id number] Endoscopist: Particia Lather , , [id number] Age: 53 Referring MD:  Date of Birth: 06/04/1970 Gender: Female Account #: [id number] Procedure:                Colonoscopy Indications:              Screening for colorectal malignant neoplasm, This                            is the patient's first colonoscopy Medicines:                Monitored Anesthesia Care Procedure:                Pre-Anesthesia Assessment:                           - Prior to the procedure, a History and Physical                            was performed, and patient medications and                            allergies were reviewed. The patient's tolerance of                            previous anesthesia was also reviewed. The risks                            and benefits of the procedure and the sedation                            options and risks were discussed with the patient.                            All questions were answered, and informed consent                            was obtained. Prior Anticoagulants: The patient has                            taken no anticoagulant or antiplatelet agents. ASA                            Grade Assessment: II - A patient with mild systemic                            disease. After reviewing the risks and benefits,                            the patient was deemed in satisfactory condition to                            undergo the procedure.  After obtaining informed consent, the colonoscope                            was passed under direct vision. Throughout the                            procedure, the patient's blood pressure, pulse, and                            oxygen saturations were monitored continuously. The                            CF [id number] #[id number] was introduced through the anus                            and advanced to  the the terminal ileum. The                            colonoscopy was performed without difficulty. The                            patient tolerated the procedure well. The quality                            of the bowel preparation was excellent. The                            terminal ileum, ileocecal valve, appendiceal                            orifice, and rectum were photographed. Scope In: 8:20:02 AM Scope Out: 8:35:24 AM Scope Withdrawal Time: 0 hours 10 minutes 39 seconds  Total Procedure Duration: 0 hours 15 minutes 22 seconds  Findings:                 The terminal ileum appeared normal.                           Non-bleeding internal hemorrhoids were found during                            retroflexion. Complications:            No immediate complications. Estimated Blood Loss:     Estimated blood loss: none. Impression:               - The examined portion of the ileum was normal.                           - Non-bleeding internal hemorrhoids.                           - No specimens collected. Recommendation:           - Discharge patient to home (with escort).                           -  Repeat colonoscopy in 10 years for screening                            purposes.                           - The findings and recommendations were discussed                            with the patient. Dr Particia Lather "[place]" [place],  03/03/2023 8:38:51 AM

## 2023-03-03 NOTE — Patient Instructions (Addendum)
 Repeat colonoscopy in 10 years for screening purposes.   Please read over handout                         YOU HAD AN ENDOSCOPIC PROCEDURE TODAY AT THE [facility]:   Refer to the procedure report that was given to you for any specific questions about what was found during the examination.  If the procedure report does not answer your questions, please call your gastroenterologist to clarify.  If you requested that your care partner not be given the details of your procedure findings, then the procedure report has been included in a sealed envelope for you to review at your convenience later.  YOU SHOULD EXPECT: Some feelings of bloating in the abdomen. Passage of more gas than usual.  Walking can help get rid of the air that was put into your GI tract during the procedure and reduce the bloating. If you had a lower endoscopy (such as a colonoscopy or flexible sigmoidoscopy) you may notice spotting of blood in your stool or on the toilet paper. If you underwent a bowel prep for your procedure, you may not have a normal bowel movement for a few days.  Please Note:  You might notice some irritation and congestion in your nose or some drainage.  This is from the oxygen used during your procedure.  There is no need for concern and it should clear up in a day or so.  SYMPTOMS TO REPORT IMMEDIATELY:  Following lower endoscopy (colonoscopy or flexible sigmoidoscopy):  Excessive amounts of blood in the stool  Significant tenderness or worsening of abdominal pains  Swelling of the abdomen that is new, acute  Fever of 100F or higher For urgent or emergent issues, a gastroenterologist can be reached at any hour by calling [phone]. Do not use MyChart messaging for urgent concerns.    DIET:  We do recommend a small meal at first, but then you may proceed to your regular diet.  Drink plenty of fluids but you should avoid alcoholic beverages for 24 hours.  ACTIVITY:  You should plan to  take it easy for the rest of today and you should NOT DRIVE or use heavy machinery until tomorrow (because of the sedation medicines used during the test).    FOLLOW UP: Our staff will call the number listed on your records the next business day following your procedure.  We will call around 7:15- 8:00 am to check on you and address any questions or concerns that you may have regarding the information given to you following your procedure. If we do not reach you, we will leave a message.     SIGNATURES/CONFIDENTIALITY: You and/or your care partner have signed paperwork which will be entered into your electronic medical record.  These signatures attest to the fact that that the information above on your After Visit Summary has been reviewed and is understood.  Full responsibility of the confidentiality of this discharge information lies with you and/or your care-partner.

## 2023-03-03 NOTE — Progress Notes (Signed)
 To pacu, VSS. Report to RN.tb

## 2023-03-03 NOTE — Progress Notes (Signed)
 GASTROENTEROLOGY PROCEDURE H&P NOTE   Primary Care Physician: Patient, No Pcp Per    Reason for Procedure:   Colon cancer screening  Plan:    Colonoscopy  Patient is appropriate for endoscopic procedure(s) in the ambulatory ([facility]) setting.  The nature of the procedure, as well as the risks, benefits, and alternatives were carefully and thoroughly reviewed with the patient. Ample time for discussion and questions allowed. The patient understood, was satisfied, and agreed to proceed.     HPI: Melissa Hartman is a 53 y.o. female who presents for colonoscopy for colon cancer screening. Denies blood in stools, changes in bowel habits, or unintentional weight loss. Denies family history of colon cancer. This is her first colonoscopy.  Past Medical History:  Diagnosis Date   Thyroid disease     History reviewed. No pertinent surgical history.  Prior to Admission medications   Medication Sig Start Date End Date Taking? Authorizing Provider  BIOTIN PO Take by mouth.   Yes [provider]  cyanocobalamin 100 MCG tablet Take by mouth.   Yes [provider]  levothyroxine (SYNTHROID) 112 MCG tablet Take 112 mcg by mouth daily before breakfast. 01/10/23  Yes [provider]  VITAMIN D PO Take by mouth.   Yes [provider]    Current Outpatient Medications  Medication Sig Dispense Refill   BIOTIN PO Take by mouth.     cyanocobalamin 100 MCG tablet Take by mouth.     levothyroxine (SYNTHROID) 112 MCG tablet Take 112 mcg by mouth daily before breakfast.     VITAMIN D PO Take by mouth.     Current Facility-Administered Medications  Medication Dose Route Frequency Provider Last Rate Last Admin   0.9 %  sodium chloride infusion  500 mL Intravenous Once Imogene Burn, MD        Allergies as of 03/03/2023   (No Known Allergies)    Family History  Problem Relation Age of Onset   Thyroid disease Mother    Colon cancer Neg Hx     Esophageal cancer Neg Hx    Rectal cancer Neg Hx    Stomach cancer Neg Hx     Social History   Socioeconomic History   Marital status: Married    Spouse name: Not on file   Number of children: Not on file   Years of education: Not on file   Highest education level: Not on file  Occupational History   Not on file  Tobacco Use   Smoking status: Never   Smokeless tobacco: Current  Substance and Sexual Activity   Alcohol use: Never   Drug use: Never   Sexual activity: Not on file  Other Topics Concern   Not on file  Social History Narrative   Not on file   Social Drivers of Health   Financial Resource Strain: Not on file  Food Insecurity: Not on file  Transportation Needs: Not on file  Physical Activity: Not on file  Stress: Not on file  Social Connections: Not on file  Intimate Partner Violence: Not on file    Physical Exam: Vital signs in last 24 hours: BP 97/60   Pulse 62   Temp 98 F (36.7 C)   Resp 16   Ht 5' 1.5" (1.562 m)   Wt 138 lb (62.6 kg)   SpO2 100%   BMI 25.65 kg/m  GEN: NAD EYE: Sclerae anicteric ENT: MMM CV: Non-tachycardic Pulm: No increased work of breathing GI: Soft, NT/ND  NEURO:  Alert & Oriented   Eulah Pont, MD [facility]  03/03/2023 8:17 AM

## 2023-03-06 ENCOUNTER — Telehealth: Payer: Self-pay

## 2023-03-06 NOTE — Telephone Encounter (Signed)
  Follow up Call-     03/03/2023    7:11 AM  Call back number  Post procedure Call Back phone  # [phone]  Permission to leave phone message Yes     Patient questions:  Do you have a fever, pain , or abdominal swelling? No. Pain Score  0 *  Have you tolerated food without any problems? Yes.  Have you been able to return to your normal activities? Yes.    Do you have any questions about your discharge instructions: Diet   No. Medications  No. Follow up visit  No.  Do you have questions or concerns about your Care? No.  Actions: * If pain score is 4 or above: No action needed, pain <4.

## 2023-03-17 DIAGNOSIS — R739 Hyperglycemia, unspecified: Secondary | ICD-10-CM | POA: Diagnosis not present

## 2023-03-17 DIAGNOSIS — R252 Cramp and spasm: Secondary | ICD-10-CM | POA: Diagnosis not present

## 2023-03-17 DIAGNOSIS — R5383 Other fatigue: Secondary | ICD-10-CM | POA: Diagnosis not present

## 2023-03-17 DIAGNOSIS — E663 Overweight: Secondary | ICD-10-CM | POA: Diagnosis not present

## 2023-03-17 DIAGNOSIS — E559 Vitamin D deficiency, unspecified: Secondary | ICD-10-CM | POA: Diagnosis not present

## 2023-03-17 DIAGNOSIS — E039 Hypothyroidism, unspecified: Secondary | ICD-10-CM | POA: Diagnosis not present

## 2023-04-03 DIAGNOSIS — J309 Allergic rhinitis, unspecified: Secondary | ICD-10-CM | POA: Diagnosis not present

## 2023-04-03 DIAGNOSIS — E663 Overweight: Secondary | ICD-10-CM | POA: Diagnosis not present

## 2023-04-03 DIAGNOSIS — E1165 Type 2 diabetes mellitus with hyperglycemia: Secondary | ICD-10-CM | POA: Diagnosis not present

## 2023-04-03 DIAGNOSIS — E1121 Type 2 diabetes mellitus with diabetic nephropathy: Secondary | ICD-10-CM | POA: Diagnosis not present

## 2023-05-23 DIAGNOSIS — R5383 Other fatigue: Secondary | ICD-10-CM | POA: Diagnosis not present

## 2023-08-29 DIAGNOSIS — W57XXXA Bitten or stung by nonvenomous insect and other nonvenomous arthropods, initial encounter: Secondary | ICD-10-CM | POA: Diagnosis not present

## 2023-08-29 DIAGNOSIS — S80861A Insect bite (nonvenomous), right lower leg, initial encounter: Secondary | ICD-10-CM | POA: Diagnosis not present

## 2023-10-05 DIAGNOSIS — Z1231 Encounter for screening mammogram for malignant neoplasm of breast: Secondary | ICD-10-CM | POA: Diagnosis not present

## 2023-10-05 DIAGNOSIS — Z131 Encounter for screening for diabetes mellitus: Secondary | ICD-10-CM | POA: Diagnosis not present

## 2023-10-05 DIAGNOSIS — Z124 Encounter for screening for malignant neoplasm of cervix: Secondary | ICD-10-CM | POA: Diagnosis not present

## 2023-10-05 DIAGNOSIS — Z01419 Encounter for gynecological examination (general) (routine) without abnormal findings: Secondary | ICD-10-CM | POA: Diagnosis not present

## 2023-10-05 DIAGNOSIS — Z13220 Encounter for screening for lipoid disorders: Secondary | ICD-10-CM | POA: Diagnosis not present

## 2023-10-05 DIAGNOSIS — Z1151 Encounter for screening for human papillomavirus (HPV): Secondary | ICD-10-CM | POA: Diagnosis not present

## 2023-10-05 DIAGNOSIS — Z13228 Encounter for screening for other metabolic disorders: Secondary | ICD-10-CM | POA: Diagnosis not present

## 2023-10-05 DIAGNOSIS — Z130 Encounter for screening for diseases of the blood and blood-forming organs and certain disorders involving the immune mechanism: Secondary | ICD-10-CM | POA: Diagnosis not present

## 2023-10-05 DIAGNOSIS — Z6826 Body mass index (BMI) 26.0-26.9, adult: Secondary | ICD-10-CM | POA: Diagnosis not present

## 2023-10-05 DIAGNOSIS — R319 Hematuria, unspecified: Secondary | ICD-10-CM | POA: Diagnosis not present

## 2023-12-06 DIAGNOSIS — L814 Other melanin hyperpigmentation: Secondary | ICD-10-CM | POA: Diagnosis not present

## 2023-12-06 DIAGNOSIS — D225 Melanocytic nevi of trunk: Secondary | ICD-10-CM | POA: Diagnosis not present

## 2023-12-06 DIAGNOSIS — L905 Scar conditions and fibrosis of skin: Secondary | ICD-10-CM | POA: Diagnosis not present

## 2024-02-27 ENCOUNTER — Ambulatory Visit: Admitting: "Endocrinology
